# Patient Record
Sex: Male | Born: 1995 | Race: Black or African American | Hispanic: No | Marital: Single | Smoking: Current every day smoker
Health system: Southern US, Community
[De-identification: ages and names within clinical notes are randomized; demographics above are authoritative.]

---

## 2018-10-21 ENCOUNTER — Emergency Department (HOSPITAL_COMMUNITY): Payer: 59

## 2018-10-21 ENCOUNTER — Emergency Department (HOSPITAL_COMMUNITY)
Admission: EM | Admit: 2018-10-21 | Discharge: 2018-10-21 | Disposition: A | Discharge status: Home / Self Care | Payer: 59 | Attending: Emergency Medicine | Admitting: Emergency Medicine

## 2018-10-21 ENCOUNTER — Other Ambulatory Visit: Payer: Self-pay

## 2018-10-21 ENCOUNTER — Encounter (HOSPITAL_COMMUNITY): Payer: Self-pay | Admitting: *Deleted

## 2018-10-21 DIAGNOSIS — R079 Chest pain, unspecified: Secondary | ICD-10-CM | POA: Diagnosis not present

## 2018-10-21 DIAGNOSIS — R202 Paresthesia of skin: Secondary | ICD-10-CM | POA: Diagnosis present

## 2018-10-21 DIAGNOSIS — F17200 Nicotine dependence, unspecified, uncomplicated: Secondary | ICD-10-CM | POA: Insufficient documentation

## 2018-10-21 DIAGNOSIS — R0602 Shortness of breath: Secondary | ICD-10-CM | POA: Insufficient documentation

## 2018-10-21 LAB — BASIC METABOLIC PANEL
Anion gap: 9 (ref 5–15)
BUN: 8 mg/dL (ref 6–20)
CO2: 27 mmol/L (ref 22–32)
Calcium: 9.3 mg/dL (ref 8.9–10.3)
Chloride: 106 mmol/L (ref 98–111)
Creatinine, Ser: 0.9 mg/dL (ref 0.61–1.24)
GFR calc Af Amer: >60 mL/min (ref >60)
GFR calc non Af Amer: >60 mL/min (ref >60)
Glucose, Bld: 95 mg/dL (ref 70–99)
Potassium: 3.4 mmol/L — ABNORMAL LOW (ref 3.5–5.1)
Sodium: 142 mmol/L (ref 135–145)

## 2018-10-21 LAB — CBC WITH DIFFERENTIAL/PLATELET
Abs Immature Granulocytes: 0.01 10*3/uL (ref 0.00–0.07)
Basophils Absolute: 0 10*3/uL (ref 0.0–0.1)
Basophils Relative: 1 %
Eosinophils Absolute: 0.1 10*3/uL (ref 0.0–0.5)
Eosinophils Relative: 1 %
HCT: 47 % (ref 39.0–52.0)
Hemoglobin: 15 g/dL (ref 13.0–17.0)
Immature Granulocytes: 0 %
Lymphocytes Relative: 27 %
Lymphs Abs: 1.7 10*3/uL (ref 0.7–4.0)
MCH: 26.1 pg (ref 26.0–34.0)
MCHC: 31.9 g/dL (ref 30.0–36.0)
MCV: 81.7 fL (ref 80.0–100.0)
Monocytes Absolute: 0.4 10*3/uL (ref 0.1–1.0)
Monocytes Relative: 7 %
Neutro Abs: 4.3 10*3/uL (ref 1.7–7.7)
Neutrophils Relative %: 64 %
Platelets: 264 10*3/uL (ref 150–400)
RBC: 5.75 MIL/uL (ref 4.22–5.81)
RDW: 14.6 % (ref 11.5–15.5)
WBC: 6.6 10*3/uL (ref 4.0–10.5)
nRBC: 0 % (ref 0.0–0.2)

## 2018-10-21 LAB — RAPID URINE DRUG SCREEN, HOSP PERFORMED
Amphetamines: NOT DETECTED
Barbiturates: NOT DETECTED
Benzodiazepines: NOT DETECTED
Cocaine: NOT DETECTED
Opiates: NOT DETECTED
Tetrahydrocannabinol: POSITIVE — AB

## 2018-10-21 LAB — MAGNESIUM: Magnesium: 2.1 mg/dL (ref 1.7–2.4)

## 2018-10-21 LAB — TROPONIN I: Troponin I: <0.03 ng/mL (ref <0.03)

## 2018-10-21 LAB — TSH: TSH: 1.32 u[IU]/mL (ref 0.350–4.500)

## 2018-10-21 MED ORDER — LORAZEPAM 1 MG PO TABS: 1.0000 mg | ORAL_TABLET | Freq: Once | ORAL | Status: DC

## 2018-10-21 MED ORDER — HYDROXYZINE HCL 25 MG PO TABS: 25.0000 mg | ORAL_TABLET | Freq: Four times a day (QID) | ORAL | 0 refills | Status: AC | PRN

## 2018-10-21 MED ORDER — LORAZEPAM 2 MG/ML IJ SOLN
1.0000 mg | Freq: Once | INTRAMUSCULAR | Status: AC
  Administered 2018-10-21: 1 mg via INTRAMUSCULAR
  Filled 2018-10-21: qty 1

## 2018-10-21 NOTE — Discharge Instructions (Signed)
Were seen in the emergency department with shortness of breath symptoms and tingling.  Your lab work looks unremarkable.  I am prescribing a medication to take as needed for the symptoms.  This may cause some drowsiness so do not take if you will be driving a car or operating heavy machinery.  I would like for you to establish care with a primary care physician who can continue to follow the symptoms as an outpatient.  Please call tomorrow to schedule the next available appointment.  Return to the emergency department with any new or sudden worsening symptoms.

## 2018-10-21 NOTE — ED Triage Notes (Addendum)
Pt was at work today and he started to feel tingling in his face, hands and legs, his body became very tense and he started shaking all over. Pt reports he is aware these episodes are happening but he isn't able to control his body during the episodes.The episodes last about 5-6 minutes per mom. Pt said this all started last Monday and has been happening almost every day. Pt was seen at [facility] ED on Saturday and was told he has anxiety. Pt and mother report no hx of anxiety and no anxious situations lately. Pt reports feeling very tired after the episodes but denies confusion.

## 2018-10-21 NOTE — ED Provider Notes (Addendum)
Emergency Department Provider Note   I have reviewed the triage vital signs and the nursing notes.   HISTORY  Chief Complaint Anxiety   HPI Jeff Warner is a 23 y.o. male with no significant PMH presents to the ED for evaluation of episodic total body tingling, chest tightness, SOB. Episodes have occurred almost daily for the last week and without provocation. They initially occurred mainly at night but today occurred while at work. He has some sharp left sided CP and SOB along with the tingling but only during these episodes. No fever, chills, vomiting, or diarrhea. No medications. Patient denies any drug use. No LOC during the episodes or confusion afterwards. No shaking during the event. The episodes last for approximately 5 minutes and then resolves. [facility] and states that labs were done and he was sent home with Motrin.   History reviewed. No pertinent past medical history.  There are no active problems to display for this patient.   History reviewed. No pertinent surgical history.  Allergies Patient has no known allergies.  No family history on file.  Social History Social History   Tobacco Use  . Smoking status: Current Every Day Smoker  . Smokeless tobacco: Never Used  Substance Use Topics  . Alcohol use: Yes    Comment: occassionally   . Drug use: Never    Review of Systems  Constitutional: No fever/chills. Positive total body tingling.  Eyes: No visual changes. ENT: No sore throat. Cardiovascular: Intermittent chest pain. Respiratory: Denies shortness of breath. Gastrointestinal: No abdominal pain.  No nausea, no vomiting.  No diarrhea.  No constipation. Genitourinary: Negative for dysuria. Musculoskeletal: Negative for back pain. Skin: Negative for rash. Neurological: Negative for headaches, focal weakness or numbness.  10-point ROS otherwise negative.  ____________________________________________   PHYSICAL EXAM:   VITAL SIGNS: ED Triage Vitals  Enc Vitals Group     BP 10/21/18 1301 118/82     Pulse Rate 10/21/18 1301 92     Resp 10/21/18 1301 14     Temp 10/21/18 1301 98 F (36.7 C)     Temp Source 10/21/18 1301 Oral     SpO2 10/21/18 1301 100 %     Weight 10/21/18 1304 125 lb (56.7 kg)     Height 10/21/18 1304 5\' 5"  (1.651 m)   Constitutional: Alert and oriented. Well appearing and in no acute distress. Eyes: Conjunctivae are normal.  Head: Atraumatic. Nose: No congestion/rhinnorhea. Mouth/Throat: Mucous membranes are moist.  Neck: No stridor.  Cardiovascular: Normal rate, regular rhythm. Good peripheral circulation. Grossly normal heart sounds.   Respiratory: Normal respiratory effort.  No retractions. Lungs CTAB. Gastrointestinal: Soft and nontender. No distention.  Musculoskeletal: No lower extremity tenderness nor edema. No gross deformities of extremities. Neurologic:  Normal speech and language. No gross focal neurologic deficits are appreciated.  Skin:  Skin is warm, dry and intact. No rash noted.  ____________________________________________   LABS (all labs ordered are listed, but only abnormal results are displayed)  Labs Reviewed  BASIC METABOLIC PANEL - Abnormal; Notable for the following components:      Result Value   Potassium 3.4 (*)    All other components within normal limits  RAPID URINE DRUG SCREEN, HOSP PERFORMED - Abnormal; Notable for the following components:   Tetrahydrocannabinol POSITIVE (*)    All other components within normal limits  CBC WITH DIFFERENTIAL/PLATELET  TROPONIN I  TSH  MAGNESIUM   ____________________________________________  EKG   EKG  Interpretation  Date/Time:  Tuesday Oct 21 2018 15:43:57 EDT Ventricular Rate:  70 PR Interval:    QRS Duration: 94 QT Interval:  360 QTC Calculation: 389 R Axis:   70 Text Interpretation:  Sinus rhythm LAE, consider biatrial enlargement No STEMI.  Confirmed by Alona Bene [phone]) on  10/21/2018 5:52:52 PM       ____________________________________________  RADIOLOGY  Dg Chest Portable 1 View  Result Date: 10/21/2018 CLINICAL DATA:  Facial tingling EXAM: PORTABLE CHEST 1 VIEW COMPARISON:  None. FINDINGS: Normal heart size. Lungs clear. No pneumothorax. No pleural effusion. IMPRESSION: No active disease. Electronically Signed   By: Jolaine Click M.D.   On: 10/21/2018 15:52    ____________________________________________   PROCEDURES  Procedure(s) performed:   Procedures  None  ____________________________________________   INITIAL IMPRESSION / ASSESSMENT AND PLAN / ED COURSE  Pertinent labs & imaging results that were available during my care of the patient were reviewed by me and considered in my medical decision making (see chart for details).   Patient presents to the ED with episodes which sound like panic attacks. No evidence on history to suspect seizure. No stigmata of seizure on exam. CP is intermittent. No active symptoms at this time. Was seen in the ED recently but I cannot review this visit or lab results. Patient states he was only given Motrin. Plan for screening labs, TSH, CXR with CP, and likely home with Atarax PRN for anxiety symptoms.   Labs, EKG, chest x-ray reviewed.  TSH normal.  Plan for Atarax as needed and PCP follow-up.   06:58 PM  I went to go update the patient regarding his lab test.  He was breathing rapidly and walking around the room.  No obvious seizure activity.  He received Ativan and improved.  On my reevaluation now the patient is feeling much better and talking on the phone.  I discussed managing this at home with medications provided while he is establishing care with a PCP.  Discussed ED return precautions.  He has a ride home after the Ativan.  ____________________________________________  FINAL CLINICAL IMPRESSION(S) / ED DIAGNOSES  Final diagnoses:  SOB (shortness of breath)  Paresthesias     NEW OUTPATIENT  MEDICATIONS STARTED DURING THIS VISIT:  New Prescriptions   HYDROXYZINE (ATARAX/VISTARIL) 25 MG TABLET    Take 1 tablet (25 mg total) by mouth every 6 (six) hours as needed for anxiety.    Note:  This document was prepared using Dragon voice recognition software and may include unintentional dictation errors.  Alona Bene, MD Emergency Medicine    Long, Arlyss Repress, MD 10/21/18 Mary Sella, MD 10/21/18 1900

## 2020-05-17 ENCOUNTER — Emergency Department (HOSPITAL_COMMUNITY)
Admission: EM | Admit: 2020-05-17 | Discharge: 2020-05-17 | Disposition: A | Discharge status: Home / Self Care | Payer: BC Managed Care – PPO | Attending: Emergency Medicine | Admitting: Emergency Medicine

## 2020-05-17 ENCOUNTER — Emergency Department (HOSPITAL_COMMUNITY): Payer: BC Managed Care – PPO

## 2020-05-17 ENCOUNTER — Other Ambulatory Visit: Payer: Self-pay

## 2020-05-17 ENCOUNTER — Encounter (HOSPITAL_COMMUNITY): Payer: Self-pay | Admitting: *Deleted

## 2020-05-17 DIAGNOSIS — R1033 Periumbilical pain: Secondary | ICD-10-CM | POA: Diagnosis present

## 2020-05-17 DIAGNOSIS — F17200 Nicotine dependence, unspecified, uncomplicated: Secondary | ICD-10-CM | POA: Insufficient documentation

## 2020-05-17 LAB — URINALYSIS, ROUTINE W REFLEX MICROSCOPIC
Bilirubin Urine: NEGATIVE
Glucose, UA: NEGATIVE mg/dL
Hgb urine dipstick: NEGATIVE
Ketones, ur: 5 mg/dL — AB
Leukocytes,Ua: NEGATIVE
Nitrite: NEGATIVE
Protein, ur: NEGATIVE mg/dL
Specific Gravity, Urine: 1.006 (ref 1.005–1.030)
pH: 6 (ref 5.0–8.0)

## 2020-05-17 LAB — CBC WITH DIFFERENTIAL/PLATELET
Abs Immature Granulocytes: 0.02 10*3/uL (ref 0.00–0.07)
Basophils Absolute: 0.1 10*3/uL (ref 0.0–0.1)
Basophils Relative: 1 %
Eosinophils Absolute: 0 10*3/uL (ref 0.0–0.5)
Eosinophils Relative: 0 %
HCT: 46 % (ref 39.0–52.0)
Hemoglobin: 14.8 g/dL (ref 13.0–17.0)
Immature Granulocytes: 0 %
Lymphocytes Relative: 17 %
Lymphs Abs: 1.5 10*3/uL (ref 0.7–4.0)
MCH: 26.7 pg (ref 26.0–34.0)
MCHC: 32.2 g/dL (ref 30.0–36.0)
MCV: 82.9 fL (ref 80.0–100.0)
Monocytes Absolute: 0.6 10*3/uL (ref 0.1–1.0)
Monocytes Relative: 7 %
Neutro Abs: 6.8 10*3/uL (ref 1.7–7.7)
Neutrophils Relative %: 75 %
Platelets: 233 10*3/uL (ref 150–400)
RBC: 5.55 MIL/uL (ref 4.22–5.81)
RDW: 13.6 % (ref 11.5–15.5)
WBC: 9 10*3/uL (ref 4.0–10.5)
nRBC: 0 % (ref 0.0–0.2)

## 2020-05-17 LAB — COMPREHENSIVE METABOLIC PANEL
ALT: 21 U/L (ref 0–44)
AST: 49 U/L — ABNORMAL HIGH (ref 15–41)
Albumin: 4.9 g/dL (ref 3.5–5.0)
Alkaline Phosphatase: 56 U/L (ref 38–126)
Anion gap: 10 (ref 5–15)
BUN: 11 mg/dL (ref 6–20)
CO2: 22 mmol/L (ref 22–32)
Calcium: 9 mg/dL (ref 8.9–10.3)
Chloride: 103 mmol/L (ref 98–111)
Creatinine, Ser: 1 mg/dL (ref 0.61–1.24)
GFR, Estimated: >60 mL/min (ref >60)
Glucose, Bld: 68 mg/dL — ABNORMAL LOW (ref 70–99)
Potassium: 4.2 mmol/L (ref 3.5–5.1)
Sodium: 135 mmol/L (ref 135–145)
Total Bilirubin: 0.7 mg/dL (ref 0.3–1.2)
Total Protein: 7.9 g/dL (ref 6.5–8.1)

## 2020-05-17 LAB — LIPASE, BLOOD: Lipase: 19 U/L (ref 11–51)

## 2020-05-17 MED ORDER — LIDOCAINE VISCOUS HCL 2 % MT SOLN
15.0000 mL | Freq: Once | OROMUCOSAL | Status: AC
  Administered 2020-05-17: 15 mL via ORAL
  Filled 2020-05-17: qty 15

## 2020-05-17 MED ORDER — SODIUM CHLORIDE 0.9 % IV BOLUS
1000.0000 mL | Freq: Once | INTRAVENOUS | Status: AC
  Administered 2020-05-17: 1000 mL via INTRAVENOUS

## 2020-05-17 MED ORDER — FENTANYL CITRATE (PF) 100 MCG/2ML IJ SOLN
50.0000 ug | Freq: Once | INTRAMUSCULAR | Status: AC
  Administered 2020-05-17: 50 ug via INTRAVENOUS
  Filled 2020-05-17: qty 2

## 2020-05-17 MED ORDER — IOHEXOL 300 MG/ML  SOLN
100.0000 mL | Freq: Once | INTRAMUSCULAR | Status: AC | PRN
  Administered 2020-05-17: 100 mL via INTRAVENOUS

## 2020-05-17 MED ORDER — ALUM & MAG HYDROXIDE-SIMETH 200-200-20 MG/5ML PO SUSP
30.0000 mL | Freq: Once | ORAL | Status: AC
  Administered 2020-05-17: 30 mL via ORAL
  Filled 2020-05-17: qty 30

## 2020-05-17 NOTE — ED Provider Notes (Signed)
[facility] EMERGENCY DEPARTMENT Provider Note   CSN: [id number] Arrival date & time: 05/17/20  1721     History Chief Complaint  Patient presents with  . Abdominal Pain         Jeff Warner is a 24 y.o. male with no pertinent past medical history that presents the emergency department today for acute onset of periumbilical abdominal pain.  Patient states that he was working and then had a sharp shooting pain in his umbilical region.  Denies any inciting event.  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  Patient states he has never had pain like this before.  States that the pain is constant, feels better in a hunched over position.  States that the pain is a shooting sensation, 10 on 10.  Denies any prior abdominal surgery.  Denies any nausea, vomiting, diarrhea, constipation.  States that he was in his normal health before this.  Denies any fevers or chills.  Denies any alcohol or drug use.  Has not taken anything for this.  Denies any chest pain or shortness of breath.  Denies any back pain.  Patient states that the pain does not radiate anywhere.  Does not radiate down into his scrotum.  Denies any scrotal pain, testicular swelling or penile pain, inguinal pain.  No other complaints.  HPI     History reviewed. No pertinent past medical history.  There are no problems to display for this patient.   History reviewed. No pertinent surgical history.     No family history on file.  Social History   Tobacco Use  . Smoking status: Current Every Day Smoker  . Smokeless tobacco: Never Used  Substance Use Topics  . Alcohol use: Yes    Comment: occassionally   . Drug use: Never    Home Medications Prior to Admission medications   Medication Sig Start Date End Date Taking? Authorizing Provider  hydrOXYzine (ATARAX/VISTARIL) 25 MG tablet Take 1 tablet (25 mg total) by mouth every 6 (six) hours as needed for anxiety. 10/21/18   Long,  Arlyss Repress, MD    Allergies    Patient has no known allergies.  Review of Systems   Review of Systems  Constitutional: Negative for chills, diaphoresis, fatigue and fever.  HENT: Negative for congestion, sore throat and trouble swallowing.   Eyes: Negative for pain and visual disturbance.  Respiratory: Negative for cough, shortness of breath and wheezing.   Cardiovascular: Negative for chest pain, palpitations and leg swelling.  Gastrointestinal: Positive for abdominal pain. Negative for abdominal distention, diarrhea, nausea and vomiting.  Genitourinary: Negative for difficulty urinating.  Musculoskeletal: Negative for back pain, neck pain and neck stiffness.  Skin: Negative for pallor.  Neurological: Negative for dizziness, speech difficulty, weakness and headaches.  Psychiatric/Behavioral: Negative for confusion.    Physical Exam Updated Vital Signs BP (!) 123/57 (BP Location: Right Arm)   Pulse 81   Temp 98.7 F (37.1 C) (Oral)   Resp 18   Ht 5\' 5"  (1.651 m)   Wt 59 kg   SpO2 100%   BMI 21.63 kg/m   Physical Exam Constitutional:      General: He is not in acute distress.    Appearance: Normal appearance. He is not ill-appearing, toxic-appearing or diaphoretic.  HENT:     Mouth/Throat:     Mouth: Mucous membranes are moist.     Pharynx: Oropharynx is clear.  Eyes:     General: No  scleral icterus.    Extraocular Movements: Extraocular movements intact.     Pupils: Pupils are equal, round, and reactive to light.  Cardiovascular:     Rate and Rhythm: Normal rate and regular rhythm.     Pulses: Normal pulses.     Heart sounds: Normal heart sounds.  Pulmonary:     Effort: Pulmonary effort is normal. No respiratory distress.     Breath sounds: Normal breath sounds. No stridor. No wheezing, rhonchi or rales.  Chest:     Chest wall: No tenderness.  Abdominal:     General: Abdomen is flat. There is no distension.     Palpations: Abdomen is soft.     Tenderness: There  is abdominal tenderness in the periumbilical area. There is guarding. There is no rebound. Negative signs include Murphy's sign, Rovsing's sign and McBurney's sign.       Comments: Patient with pain in periumbilical area, there is guarding.  Patient is hunched over, and worse when patient is supine.  No ecchymosis or erythema.  Musculoskeletal:        General: No swelling or tenderness. Normal range of motion.     Cervical back: Normal range of motion and neck supple. No rigidity.     Right lower leg: No edema.     Left lower leg: No edema.  Skin:    General: Skin is warm and dry.     Capillary Refill: Capillary refill takes less than 2 seconds.     Coloration: Skin is not pale.  Neurological:     General: No focal deficit present.     Mental Status: He is alert and oriented to person, place, and time.  Psychiatric:        Mood and Affect: Mood normal.        Behavior: Behavior normal.     ED Results / Procedures / Treatments   Labs (all labs ordered are listed, but only abnormal results are displayed) Labs Reviewed  CBC WITH DIFFERENTIAL/PLATELET  COMPREHENSIVE METABOLIC PANEL  LIPASE, BLOOD  URINALYSIS, ROUTINE W REFLEX MICROSCOPIC    EKG None  Radiology No results found.  Procedures Procedures (including critical care time)  Medications Ordered in ED Medications  fentaNYL (SUBLIMAZE) injection 50 mcg (has no administration in time range)  sodium chloride 0.9 % bolus 1,000 mL (has no administration in time range)    ED Course  I have reviewed the triage vital signs and the nursing notes.  Pertinent labs & imaging results that were available during my care of the patient were reviewed by me and considered in my medical decision making (see chart for details).    MDM Rules/Calculators/A&P                            Jeff Warner is a 24 y.o. male with no pertinent past medical history that presents the emerge department today for acute onset of umbilical  abdominal pain.Differential diagnoses considered include appendicitis, pancreatitis, gastritis, gastroenteritis, muscle strain, colitis.  Initial interventions fentanyl and fluid.  Pt care was handed off to K. Sophia PA-C at  handoff.  Complete history and physical and current plan have been communicated.  Please refer to their note for the remainder of ED care and ultimate disposition.  Awaiting labs, urine and CT scan for disposition.    Final Clinical Impression(s) / ED Diagnoses Final diagnoses:  Periumbilical pain    Rx / DC Orders ED Discharge Orders  None       Farrel Gordon, PA-C 05/17/20 1845    Jacalyn Lefevre, MD 05/17/20 Windell Moment

## 2020-05-17 NOTE — ED Provider Notes (Signed)
Pt's care assumed at 7pm.  Labs and ct pending.  Pt had sudden onset of severe pain. Pt reports he feels better.  Ua is normal  Labs normal  Ct abdomen and pelvis  No acute abnormality .  Pt counseled on results.     Osie Cheeks 05/17/20 2150    Jacalyn Lefevre, MD 05/19/20 [phone]

## 2020-05-17 NOTE — ED Triage Notes (Signed)
Abdominal pain onset about and hour ago

## 2020-05-17 NOTE — Discharge Instructions (Signed)
Return if any problems.  Follow up with your Physicain for recheck  

## 2022-02-22 IMAGING — CT CT ABD-PELV W/ CM
2 of 4 series · 16 of 46 positions shown, 18 images · IV contrast (Omnipaque or Isovue)
Comparison: None.

CLINICAL DATA: Periumbilical abdominal pain.

EXAM:
CT ABDOMEN AND PELVIS WITH CONTRAST
TECHNIQUE: Multidetector CT imaging of the abdomen and pelvis was performed
using the standard protocol following bolus administration of
intravenous contrast.
CONTRAST:  100mL OMNIPAQUE IOHEXOL 300 MG/ML  SOLN

[Series 2: axial st · axial · 0.61mm/px · z∈[-612,-238]mm · 13 of 83 slices shown, 15 images]
[im 4/83  soft-tissue]
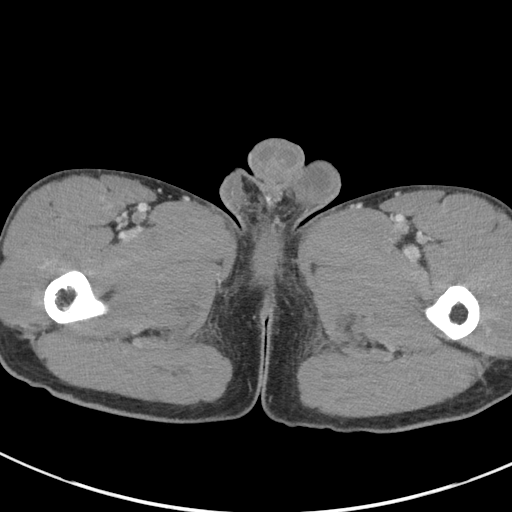
[im 4/83  bone]
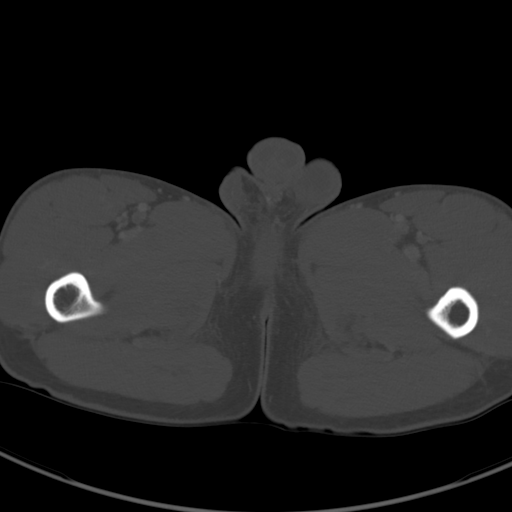
[im 10/83  soft-tissue]
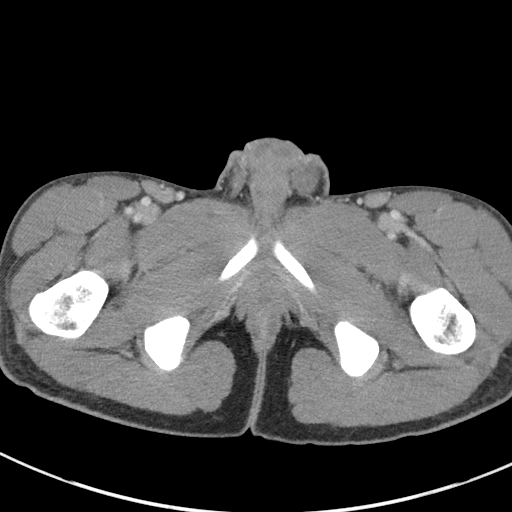
[im 17/83  soft-tissue]
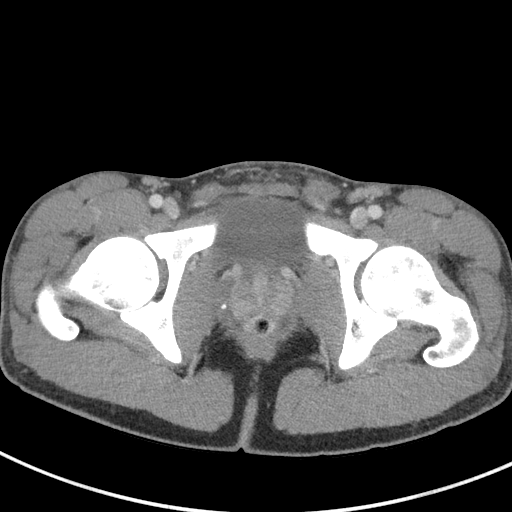
[im 23/83  soft-tissue]
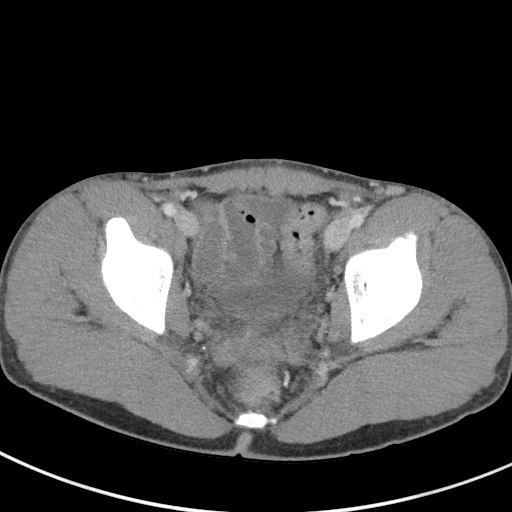
[im 30/83  soft-tissue]
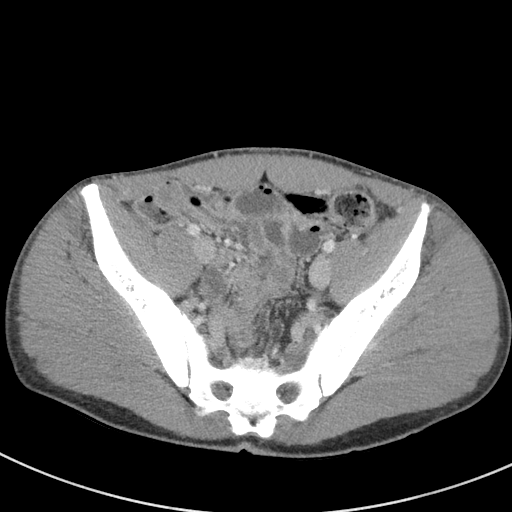
[im 37/83  soft-tissue]
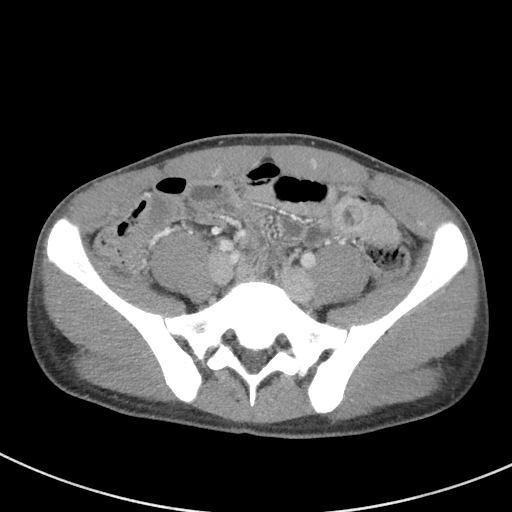
[im 43/83  soft-tissue]
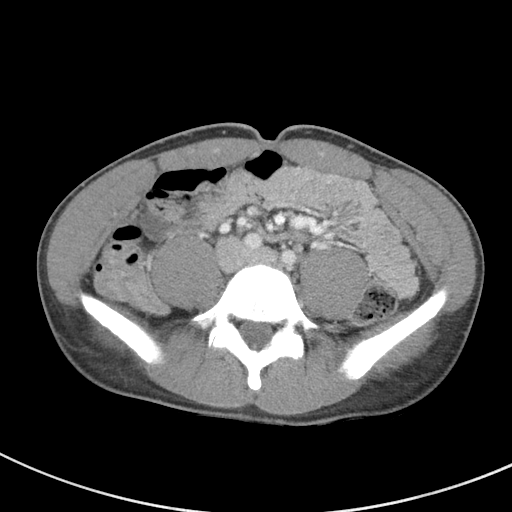
[im 46/83  soft-tissue]
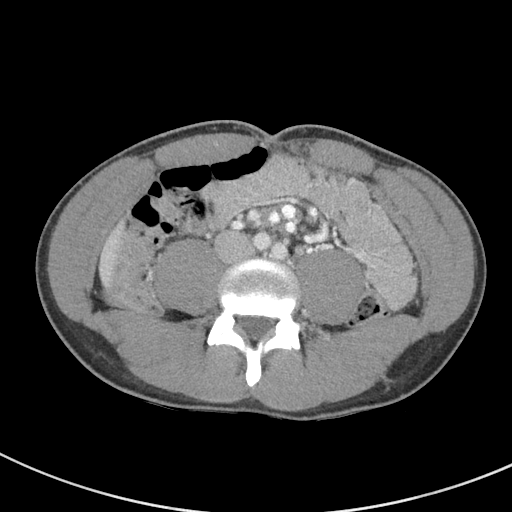
[im 53/83  soft-tissue]
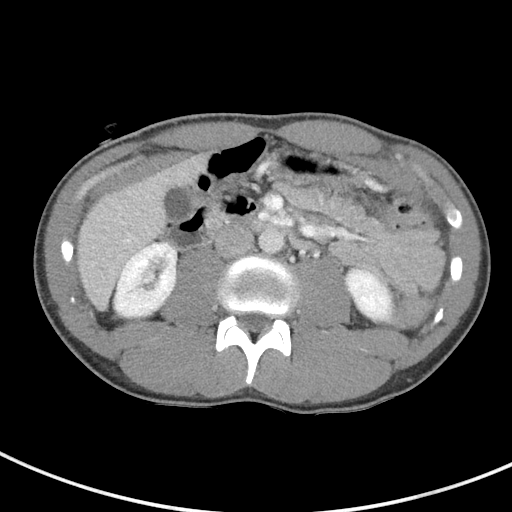
[im 53/83  bone]
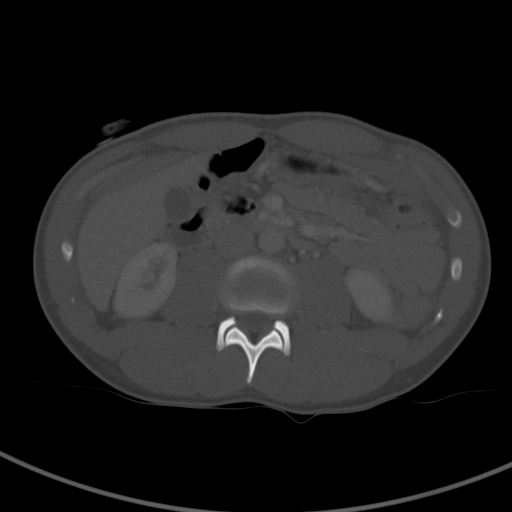
[im 60/83  soft-tissue]
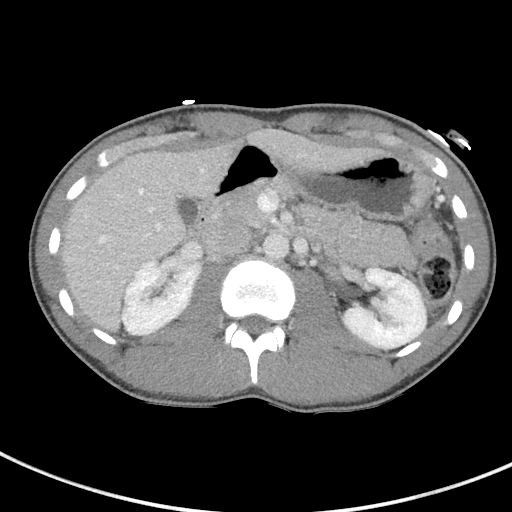
[im 66/83  soft-tissue]
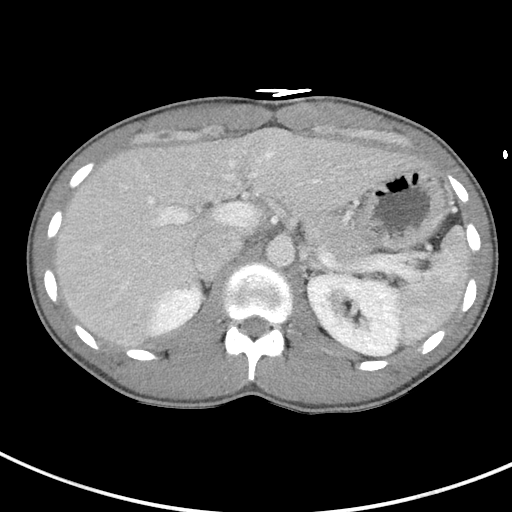
[im 73/83  soft-tissue]
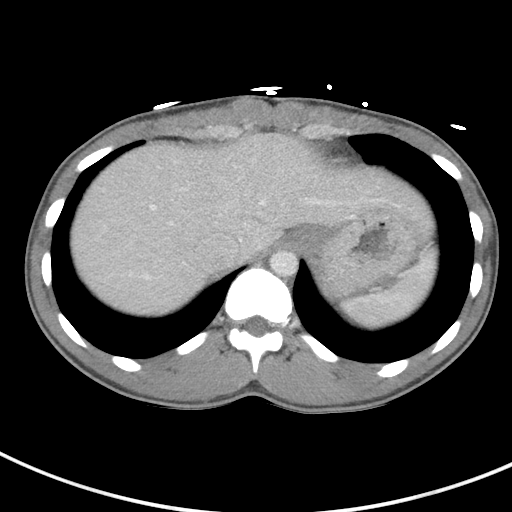
[im 79/83  soft-tissue]
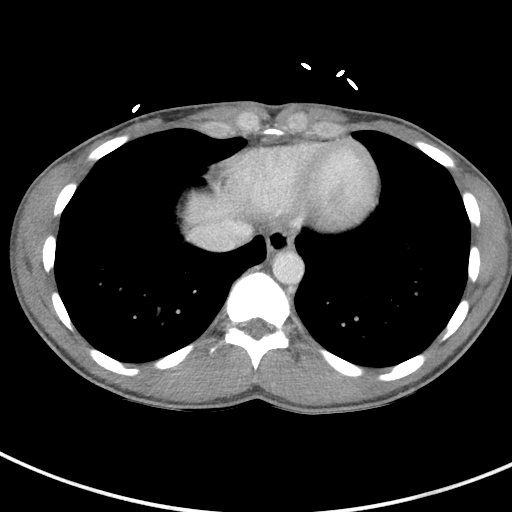

[Series 5: coronal st · coronal · 0.61mm/px · 3 of 74 slices shown]
[im 25/74  soft-tissue]
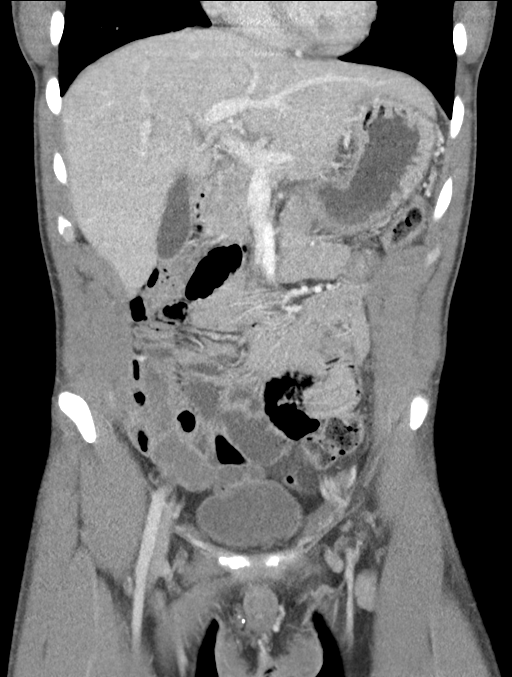
[im 33/74  soft-tissue]
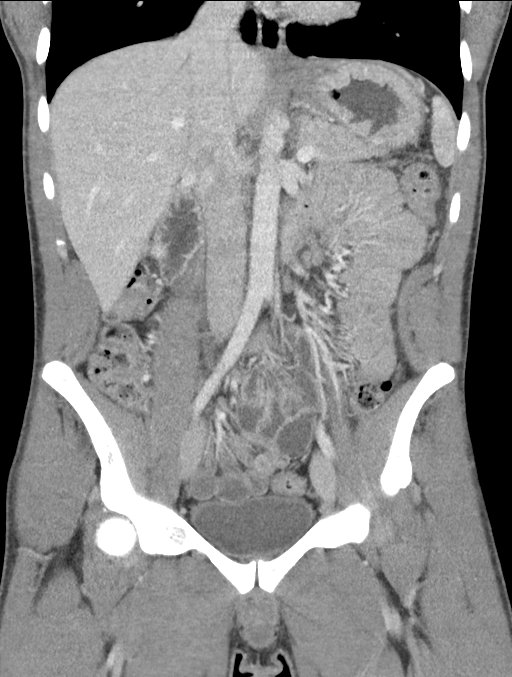
[im 41/74  soft-tissue]
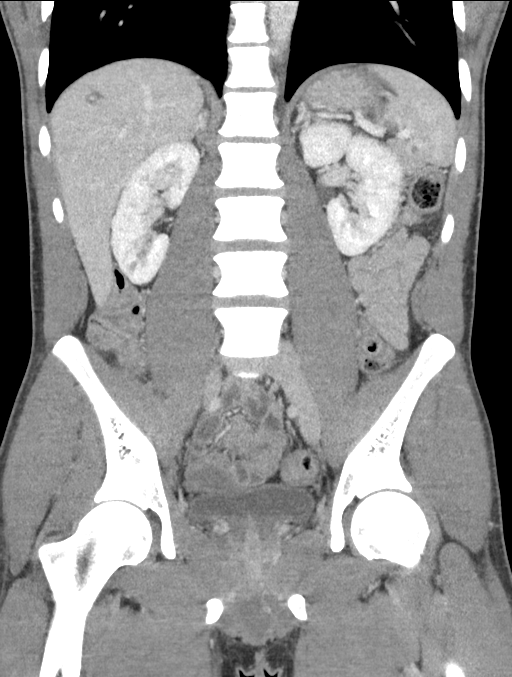

[16 of 46 positions shown; findings below may reference images not displayed]

FINDINGS: Lower chest: No acute abnormality.

Hepatobiliary: 2 mm, 7 mm and 9 mm foci of low attenuation are seen
within the posterolateral aspect of the right lobe of the liver. No
gallstones, gallbladder wall thickening, or biliary dilatation.

Pancreas: Unremarkable. No pancreatic ductal dilatation or
surrounding inflammatory changes.

Spleen: Normal in size without focal abnormality.

Adrenals/Urinary Tract: Adrenal glands are unremarkable. Kidneys are
normal, without renal calculi, focal lesion, or hydronephrosis.
Bladder is unremarkable.

Stomach/Bowel: Stomach is within normal limits. Appendix appears
normal. No evidence of bowel wall thickening, distention, or
inflammatory changes.

Vascular/Lymphatic: No significant vascular findings are present. No
enlarged abdominal or pelvic lymph nodes.

Reproductive: Prostate is unremarkable.

Other: No abdominal wall hernia or abnormality. No abdominopelvic
ascites.

Musculoskeletal: No acute or significant osseous findings.
IMPRESSION: 1. No acute or active process within the abdomen or pelvis.
2. Subcentimeter low-attenuation liver lesions which may represent
small hemangiomas. In a low risk patient, no further follow-up is
recommended ( 9324 [HOSPITAL]).
